# Patient Record
Sex: Female | Born: 1935 | Race: White | Hispanic: No
Health system: Southern US, Community
[De-identification: ages and names within clinical notes are randomized; demographics above are authoritative.]

## PROBLEM LIST (undated history)

## (undated) DIAGNOSIS — I2510 Atherosclerotic heart disease of native coronary artery without angina pectoris: Secondary | ICD-10-CM

---

## 2011-07-29 ENCOUNTER — Inpatient Hospital Stay (INDEPENDENT_AMBULATORY_CARE_PROVIDER_SITE_OTHER)
Admission: RE | Admit: 2011-07-29 | Discharge: 2011-07-29 | Disposition: A | Discharge status: Home / Self Care | Payer: 59 | Source: Ambulatory Visit | Attending: Family Medicine | Admitting: Family Medicine

## 2011-07-29 ENCOUNTER — Encounter: Payer: Self-pay | Admitting: Family Medicine

## 2011-07-29 DIAGNOSIS — S90129A Contusion of unspecified lesser toe(s) without damage to nail, initial encounter: Secondary | ICD-10-CM

## 2011-07-29 DIAGNOSIS — I10 Essential (primary) hypertension: Secondary | ICD-10-CM | POA: Insufficient documentation

## 2011-07-29 DIAGNOSIS — L02619 Cutaneous abscess of unspecified foot: Secondary | ICD-10-CM

## 2011-07-31 ENCOUNTER — Inpatient Hospital Stay (INDEPENDENT_AMBULATORY_CARE_PROVIDER_SITE_OTHER)
Admission: RE | Admit: 2011-07-31 | Discharge: 2011-07-31 | Disposition: A | Discharge status: Home / Self Care | Payer: 59 | Source: Ambulatory Visit | Attending: Family Medicine | Admitting: Family Medicine

## 2011-07-31 ENCOUNTER — Encounter: Payer: Self-pay | Admitting: Family Medicine

## 2011-07-31 DIAGNOSIS — L03039 Cellulitis of unspecified toe: Secondary | ICD-10-CM

## 2011-07-31 DIAGNOSIS — Z4800 Encounter for change or removal of nonsurgical wound dressing: Secondary | ICD-10-CM

## 2011-08-02 ENCOUNTER — Telehealth (INDEPENDENT_AMBULATORY_CARE_PROVIDER_SITE_OTHER): Payer: Self-pay | Admitting: *Deleted

## 2011-08-03 ENCOUNTER — Encounter: Payer: Self-pay | Admitting: Family Medicine

## 2011-08-03 ENCOUNTER — Inpatient Hospital Stay (INDEPENDENT_AMBULATORY_CARE_PROVIDER_SITE_OTHER)
Admission: RE | Admit: 2011-08-03 | Discharge: 2011-08-03 | Disposition: A | Discharge status: Home / Self Care | Payer: 59 | Source: Ambulatory Visit | Attending: Family Medicine | Admitting: Family Medicine

## 2011-08-03 DIAGNOSIS — L02619 Cutaneous abscess of unspecified foot: Secondary | ICD-10-CM

## 2011-08-03 DIAGNOSIS — Z4800 Encounter for change or removal of nonsurgical wound dressing: Secondary | ICD-10-CM

## 2011-08-03 DIAGNOSIS — L03039 Cellulitis of unspecified toe: Secondary | ICD-10-CM

## 2011-10-02 NOTE — Telephone Encounter (Signed)
  Phone Note Outgoing Call Call back at [facility] Phone [phone]   Call placed by: Lajean Saver RN,  August 02, 2011 2:17 PM Call placed to: Patient Action Taken: Phone Call Completed Summary of Call: Callback: Patient denies any pain and has not taken the bandage off. I reminded her to come back tomorrow for a f/u on her toe. She verbalized understanding.

## 2011-10-02 NOTE — Progress Notes (Signed)
Summary: Recheck Toe   Vital Signs:  Patient Profile:   77 Years Old Female CC:      Blister on rt great toe re check Height:     63 inches Weight:      118 pounds O2 Sat:      96 % O2 treatment:    Room Air Temp:     97.7 degrees F oral Pulse rate:   81 / minute Pulse rhythm:   regular Resp:     18 per minute BP sitting:   107 / 68  (left arm) Cuff size:   regular  Vitals Entered By: Emilio Math (August 03, 2011 1:55 PM)                  Current Allergies: No known allergies History of Present Illness Chief Complaint: Blister on rt great toe re check History of Present Illness:  Subjective:  Patient returns for follow-up of her great toe.  She has discontinued her bandage and states that there has been no further pain.  Current Meds CEPHALEXIN 500 MG TABS (CEPHALEXIN) One by mouth three times daily (every 8 hours) * AMLODIPINE daily LISINOPRIL 20 MG TABS (LISINOPRIL) daily HYDROCHLOROTHIAZIDE 25 MG TABS (HYDROCHLOROTHIAZIDE) daily * GABAPENTIN 1-2 daily * ASA 81 MG daily  REVIEW OF SYSTEMS Constitutional Symptoms      Denies fever, chills, night sweats, weight loss, weight gain, and fatigue.  Eyes       Denies change in vision, eye pain, eye discharge, glasses, contact lenses, and eye surgery. Ear/Nose/Throat/Mouth       Denies hearing loss/aids, change in hearing, ear pain, ear discharge, dizziness, frequent runny nose, frequent nose bleeds, sinus problems, sore throat, hoarseness, and tooth pain or bleeding.  Respiratory       Denies dry cough, productive cough, wheezing, shortness of breath, asthma, bronchitis, and emphysema/COPD.  Cardiovascular       Denies murmurs, chest pain, and tires easily with exhertion.    Gastrointestinal       Denies stomach pain, nausea/vomiting, diarrhea, constipation, blood in bowel movements, and indigestion. Genitourniary       Denies painful urination, kidney stones, and loss of urinary control. Neurological  Denies paralysis, seizures, and fainting/blackouts. Musculoskeletal       Denies muscle pain, joint pain, joint stiffness, decreased range of motion, redness, swelling, muscle weakness, and gout.  Skin       Complains of unusual moles/lumps or sores.      Denies bruising and hair/skin or nail changes.  Psych       Denies mood changes, temper/anger issues, anxiety/stress, speech problems, depression, and sleep problems.  Past History:  Past Medical History: Reviewed history from 07/29/2011 and no changes required. Hypertension  Past Surgical History: Reviewed history from 07/29/2011 and no changes required. Appendectomy Tonsillectomy  Family History: Reviewed history from 07/29/2011 and no changes required. Family History of CAD Female 1st degree relative <60 Family History Hypertension  Social History: Reviewed history from 07/29/2011 and no changes required. Never Smoked Alcohol use-no Drug use-no   Objective:  Appears comfortable and alert. Right great toe:  Healing epidermis dorsum of toe.  No evidence infection.  Toe has full range of motion.  Cap refill normal. Assessment  Assessed CELLULITIS, GREAT TOE, RIGHT as improved - Donna Christen MD  Plan New Orders: Est. Patient Level II [phone] Planning Comments:   Applied Bacitracin.  No further bandage necessary.  Recommend that she apply Bacitracin daily until completely healed.  The patient and/or caregiver has been counseled thoroughly with regard to medications prescribed including dosage, schedule, interactions, rationale for use, and possible side effects and they verbalize understanding.  Diagnoses and expected course of recovery discussed and will return if not improved as expected or if the condition worsens. Patient  and/or caregiver verbalized understanding.   Orders Added: 1)  Est. Patient Level II [16109]

## 2011-10-02 NOTE — Progress Notes (Signed)
Summary: SORE TOE   Vital Signs:  Patient Profile:   77 Years Old Female CC:      left lg. toe ulceration Height:     63 inches Weight:      115 pounds O2 Sat:      98 % O2 treatment:    Room Air Temp:     98.1 degrees F oral Pulse rate:   72 / minute Resp:     16 per minute BP sitting:   134 / 80  (left arm) Cuff size:   regular  Pt. in pain?   yes    Location:   left large toe  Vitals Entered By: Lavell Islam RN (July 29, 2011 4:39 PM)                   Updated Prior Medication List: CEPHALEXIN 500 MG TABS (CEPHALEXIN) One by mouth three times daily (every 8 hours) * AMLODIPINE daily LISINOPRIL 20 MG TABS (LISINOPRIL) daily HYDROCHLOROTHIAZIDE 25 MG TABS (HYDROCHLOROTHIAZIDE) daily * GABAPENTIN 1-2 daily * ASA 81 MG daily  Current Allergies: No known allergies History of Present Illness Chief Complaint: left lg. toe ulceration History of Present Illness:  Subjective:  Patient complains of onset of a tender blister on the top of her left great toe about 3 days ago.  She believes that she may have scraped the toe with the tip of her cane but she is not sure.  She opened the blister with a needle, and the toe has become progressively more painful.  No fevers, chills, and sweats   REVIEW OF SYSTEMS       Skin       Complains of unusual moles/lumps or sores.      Comments: left lg ltoe Other Comments: left large toe ulceration x 3 days   Past History:  Past Medical History: Hypertension  Past Surgical History: Appendectomy Tonsillectomy  Family History: Family History of CAD Female 1st degree relative <60 Family History Hypertension  Social History: Never Smoked Alcohol use-no Drug use-no Smoking Status:  never Drug Use:  no   Objective:  Appearance:  Elderly female appears healthy, stated age, and in no acute distress Left foot:  Great toe has large bulla on dorsal surface (from base of nail to MTP joint) that has been partly evacuated.   The toe is mildly erythematous and tender to palpation over dorsal surface.  No joint swelling.  Good range of motion all joints.  Distal neurovascular intact   Assessment New Problems: CONTUSION OF TOE (ICD-924.3) FAMILY HISTORY OF CAD FEMALE 1ST DEGREE RELATIVE <60 (ICD-V16.49) HYPERTENSION (ICD-401.9) CELLULITIS, GREAT TOE, RIGHT (ICD-681.10)   Plan New Medications/Changes: CEPHALEXIN 500 MG TABS (CEPHALEXIN) One by mouth three times daily (every 8 hours)  #30 x 0, 07/29/2011, Donna Christen MD  New Orders: T-Culture, Wound [[id number]] Duoderm Dressing [A6250] New Patient Level IV [16109] Services provided After hours-Weekends-Holidays [99051] Planning Comments:   PROCEDURE:  With aseptic technique, partly opened bulla with iris scissors but did not debride.  Expressed remaining clear fluid and wound culture sent.  Applied Silvadene cream, followed by layer of Mepetel wound dressing.  Finally, wrapped with 1in wide sterile stretchable gauze. Advised to leave dressing in place for 48 hours then return for dressing change.  Keep bandage clean and dry.  Elevate leg.  Apply heating pad several times daily.  Return earlier for increasing pain, swelling, drainage, etc. Wound culture sent.  Begin Keflex.  May take Tylenol for pain.  The patient and/or caregiver has been counseled thoroughly with regard to medications prescribed including dosage, schedule, interactions, rationale for use, and possible side effects and they verbalize understanding.  Diagnoses and expected course of recovery discussed and will return if not improved as expected or if the condition worsens. Patient and/or caregiver verbalized understanding.  Prescriptions: CEPHALEXIN 500 MG TABS (CEPHALEXIN) One by mouth three times daily (every 8 hours)  #30 x 0   Entered and Authorized by:   Donna Christen MD   Signed by:   Donna Christen MD on 07/29/2011   Method used:   Print then Give to Patient   RxID:    [id number]   Orders Added: 1)  T-Culture, Wound [[id number]] 2)  Duoderm Dressing [A6250] 3)  New Patient Level IV [14782] 4)  Services provided After hours-Weekends-Holidays [99051]

## 2011-10-02 NOTE — Progress Notes (Signed)
Summary: rechk toe procedure rm   Vital Signs:  Patient Profile:   77 Years Old Female CC:      f/u wound ck, toe  Height:     63 inches Weight:      118.25 pounds O2 Sat:      98 % O2 treatment:    Room Air Temp:     98.1 degrees F oral Pulse rate:   100 / minute Resp:     18 per minute BP sitting:   120 / 73  (left arm) Cuff size:   regular  Vitals Entered By: Clemens Catholic LPN (July 31, 2011 1:56 PM)                  Updated Prior Medication List: CEPHALEXIN 500 MG TABS (CEPHALEXIN) One by mouth three times daily (every 8 hours) * AMLODIPINE daily LISINOPRIL 20 MG TABS (LISINOPRIL) daily HYDROCHLOROTHIAZIDE 25 MG TABS (HYDROCHLOROTHIAZIDE) daily * GABAPENTIN 1-2 daily * ASA 81 MG daily  Current Allergies (reviewed today): No known allergies History of Present Illness Chief Complaint: f/u wound ck, toe  History of Present Illness:  Subjective:  Patient reports minimal pain.  REVIEW OF SYSTEMS Constitutional Symptoms      Denies fever, chills, night sweats, weight loss, weight gain, and fatigue.  Eyes       Denies change in vision, eye pain, eye discharge, glasses, contact lenses, and eye surgery. Ear/Nose/Throat/Mouth       Denies hearing loss/aids, change in hearing, ear pain, ear discharge, dizziness, frequent runny nose, frequent nose bleeds, sinus problems, sore throat, hoarseness, and tooth pain or bleeding.  Respiratory       Denies dry cough, productive cough, wheezing, shortness of breath, asthma, bronchitis, and emphysema/COPD.  Cardiovascular       Denies murmurs, chest pain, and tires easily with exhertion.    Gastrointestinal       Denies stomach pain, nausea/vomiting, diarrhea, constipation, blood in bowel movements, and indigestion. Genitourniary       Denies painful urination, kidney stones, and loss of urinary control. Neurological       Denies paralysis, seizures, and fainting/blackouts. Musculoskeletal       Denies muscle pain,  joint pain, joint stiffness, decreased range of motion, redness, swelling, muscle weakness, and gout.  Skin       Denies bruising, unusual mles/lumps or sores, and hair/skin or nail changes.  Psych       Denies mood changes, temper/anger issues, anxiety/stress, speech problems, depression, and sleep problems. Other Comments: the pt is here today for a 2 day f/u wound ck, toe.    Past History:  Past Medical History: Reviewed history from 07/29/2011 and no changes required. Hypertension  Past Surgical History: Reviewed history from 07/29/2011 and no changes required. Appendectomy Tonsillectomy  Family History: Reviewed history from 07/29/2011 and no changes required. Family History of CAD Female 1st degree relative <60 Family History Hypertension  Social History: Reviewed history from 07/29/2011 and no changes required. Never Smoked Alcohol use-no Drug use-no   Objective:  Left great toe:  Bandage removed.  No evidence infection.  Dorsal surface of toe remains friable.  No tenderness.  Assessment  Assessed CELLULITIS, GREAT TOE, RIGHT as improved - Donna Christen MD New Problems: ENCOUNTER CHG/REMOVAL NONSURGICAL WOUND DRESSING (ICD-V58.30)  NO EVIDENCE INFECTION.  WOUND CULTURE NEGATIVE GROWTH  Plan New Orders: Est. Patient Level III [96045] Duoderm Dressing [A6250] Planning Comments:   Applied Silvadene cream.  Applied Mepelex Lite dressing.  Advised  to leave in place for 3 days then return for follow-up.  Keep wound clean and dry.  May decrease Keflex to two times a day.   The patient and/or caregiver has been counseled thoroughly with regard to medications prescribed including dosage, schedule, interactions, rationale for use, and possible side effects and they verbalize understanding.  Diagnoses and expected course of recovery discussed and will return if not improved as expected or if the condition worsens. Patient and/or caregiver verbalized understanding.    Orders Added: 1)  Est. Patient Level III [08657] 2)  Duoderm Dressing [A6250]

## 2014-03-05 ENCOUNTER — Encounter (HOSPITAL_BASED_OUTPATIENT_CLINIC_OR_DEPARTMENT_OTHER): Payer: Self-pay | Admitting: Emergency Medicine

## 2014-03-05 ENCOUNTER — Emergency Department (HOSPITAL_BASED_OUTPATIENT_CLINIC_OR_DEPARTMENT_OTHER): Payer: Medicare Other

## 2014-03-05 ENCOUNTER — Emergency Department (HOSPITAL_BASED_OUTPATIENT_CLINIC_OR_DEPARTMENT_OTHER)
Admission: EM | Admit: 2014-03-05 | Discharge: 2014-03-05 | Disposition: A | Discharge status: Other Acute Inpatient Hospital | Payer: Medicare Other | Attending: Emergency Medicine | Admitting: Emergency Medicine

## 2014-03-05 DIAGNOSIS — S0990XA Unspecified injury of head, initial encounter: Secondary | ICD-10-CM | POA: Insufficient documentation

## 2014-03-05 DIAGNOSIS — Z7982 Long term (current) use of aspirin: Secondary | ICD-10-CM | POA: Insufficient documentation

## 2014-03-05 DIAGNOSIS — Z79899 Other long term (current) drug therapy: Secondary | ICD-10-CM | POA: Insufficient documentation

## 2014-03-05 DIAGNOSIS — Z23 Encounter for immunization: Secondary | ICD-10-CM | POA: Insufficient documentation

## 2014-03-05 DIAGNOSIS — T07XXXA Unspecified multiple injuries, initial encounter: Secondary | ICD-10-CM

## 2014-03-05 DIAGNOSIS — W050XXA Fall from non-moving wheelchair, initial encounter: Secondary | ICD-10-CM | POA: Insufficient documentation

## 2014-03-05 DIAGNOSIS — IMO0002 Reserved for concepts with insufficient information to code with codable children: Secondary | ICD-10-CM | POA: Insufficient documentation

## 2014-03-05 DIAGNOSIS — Y929 Unspecified place or not applicable: Secondary | ICD-10-CM | POA: Insufficient documentation

## 2014-03-05 DIAGNOSIS — I2510 Atherosclerotic heart disease of native coronary artery without angina pectoris: Secondary | ICD-10-CM | POA: Insufficient documentation

## 2014-03-05 DIAGNOSIS — Y9389 Activity, other specified: Secondary | ICD-10-CM | POA: Insufficient documentation

## 2014-03-05 HISTORY — DX: Atherosclerotic heart disease of native coronary artery without angina pectoris: I25.10

## 2014-03-05 MED ORDER — TETANUS-DIPHTH-ACELL PERTUSSIS 5-2.5-18.5 LF-MCG/0.5 IM SUSP
0.5000 mL | Freq: Once | INTRAMUSCULAR | Status: AC
  Administered 2014-03-05: 0.5 mL via INTRAMUSCULAR
  Filled 2014-03-05: qty 0.5

## 2014-03-05 NOTE — ED Notes (Signed)
[facility] called to transport patient back to [facility]

## 2014-03-05 NOTE — ED Provider Notes (Signed)
CSN: [id number]     Arrival date & time 03/05/14  1016 History   First MD Initiated Contact with Patient 03/05/14 1025     Chief Complaint  Patient presents with  . Fall  . Head Injury    Patient is a 80 y.o. female presenting with fall and head injury. The history is provided by the patient (nurse called nursing facility).  Fall This is a new problem. The current episode started less than 1 hour ago. The problem occurs constantly. The problem has not changed since onset.Associated symptoms include headaches. Pertinent negatives include no chest pain and no abdominal pain. Nothing aggravates the symptoms. Nothing relieves the symptoms.  Head Injury Associated symptoms: headache   pt presents from nursing facility s/p fall.  She was being transferred from wheelchair when she fell She hit her head and also sustained abrasions to left UE and right LE No new neck or back pain No LOC reported She takes ASA but no anticoagulants reported  Past Medical History  Diagnosis Date  . Coronary artery disease    No past surgical history on file. No family history on file. History  Substance Use Topics  . Smoking status: Unknown If Ever Smoked  . Smokeless tobacco: Not on file  . Alcohol Use: No   OB History   Grav Para Term Preterm Abortions TAB SAB Ect Mult Living                 Review of Systems  Cardiovascular: Negative for chest pain.  Gastrointestinal: Negative for abdominal pain.  Skin: Positive for wound.  Neurological: Positive for headaches.      Allergies  Review of patient's allergies indicates no known allergies.  Home Medications   Prior to Admission medications   Medication Sig Start Date End Date Taking? Authorizing Provider  aspirin 325 MG EC tablet Take 325 mg by mouth daily.   Yes Historical Provider, MD  calcium-vitamin D (OSCAL WITH D) 250-125 MG-UNIT per tablet Take 1 tablet by mouth daily.   Yes Historical Provider, MD  diltiazem (CARDIZEM CD) 180 MG 24  hr capsule Take 180 mg by mouth daily.   Yes Historical Provider, MD  docusate sodium (COLACE) 100 MG capsule Take 100 mg by mouth 2 (two) times daily.   Yes Historical Provider, MD  Loperamide HCl (IMODIUM PO) Take by mouth.   Yes Historical Provider, MD  Magnesium Carbonate 250 MG/GM POWD Take by mouth.   Yes Historical Provider, MD  metoprolol succinate (TOPROL-XL) 25 MG 24 hr tablet Take 25 mg by mouth daily.   Yes Historical Provider, MD  mirtazapine (REMERON) 15 MG tablet Take 15 mg by mouth at bedtime.   Yes Historical Provider, MD  ondansetron (ZOFRAN) 4 MG tablet Take 4 mg by mouth every 8 (eight) hours as needed for nausea or vomiting.   Yes Historical Provider, MD  senna (SENOKOT) 8.6 MG tablet Take 1 tablet by mouth daily.   Yes Historical Provider, MD   BP 160/64  Pulse 65  Temp(Src) 98.9 F (37.2 C) (Oral)  Resp 18  Wt 118 lb (53.524 kg)  SpO2 100% Physical Exam CONSTITUTIONAL: elderly, frail, no distress, watching TV when I enter room HEAD: abrasion/hematoma to forehead no other signs of trauma EYES: EOMI ENMT: Mucous membranes moist, No evidence of facial/nasal trauma NECK: supple no meningeal signs SPINE:entire spine nontender, No bruising/crepitance/stepoffs noted to spine CV: S1/S2 noted Chest - no tenderness noted LUNGS: Lungs are clear to auscultation bilaterally, no apparent  distress ABDOMEN: soft, nontender, no rebound or guarding GU:no cva tenderness NEURO: Pt is awake/alert, moves all extremitiesx4 EXTREMITIES: pulses normal, full ROM.  Abrasion noted to right tibial surface.  Abrasion to left proximal forearm.  No lacerations noted.  All other extremities/joints palpated/ranged and nontender No tenderness with ROM of either hip SKIN: warm, color normal PSYCH: no abnormalities of mood noted   ED Course  Procedures  Imaging Review Ct Head Wo Contrast  03/05/2014   CLINICAL DATA:  Status post fall with an abrasion to the left side of the forehead.  EXAM:  CT HEAD WITHOUT CONTRAST  TECHNIQUE: Contiguous axial images were obtained from the base of the skull through the vertex without intravenous contrast.  COMPARISON:  None.  FINDINGS: There is cortical atrophy and chronic microvascular ischemic change. 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 No evidence of acute intracranial abnormality including hemorrhage, infarct, mass lesion, mass-effect, midline shift or abnormal extra-axial fluid collection is identified. Small appearing contusion over the left frontal bone is identified without underlying fracture or foreign body. There is no pneumocephalus or hydrocephalus.  IMPRESSION: Contusion over the left frontal bone without underlying fracture or acute intracranial abnormality.  Atrophy, chronic microvascular ischemic change and remote right PCA territory infarct.   Electronically Signed   By: Drusilla Kannerhomas  Dalessio M.D.   On: 03/05/2014 10:58   Pt stable She is able to bear weight No acute traumatic injury noted Stable for d/c home   MDM   Final diagnoses:  Minor head injury  Abrasions of multiple sites    Nursing notes including past medical history and social history reviewed and considered in documentation     Joya Gaskinsonald W Carson Meche, MD 03/05/14 1138

## 2014-03-05 NOTE — Discharge Instructions (Signed)

## 2014-03-05 NOTE — ED Notes (Signed)
Pt was transferring from bed to w/c and fell onto the floor.  Hematoma to left side of forehead.  Denies LOC per [facility] home staff.

## 2014-10-30 DEATH — deceased

## 2015-11-25 IMAGING — CT CT HEAD W/O CM
2 series · 16 of 30 positions shown, 18 images · non-contrast
Comparison: None.

CLINICAL DATA: Status post fall with an abrasion to the left side
of the forehead.

EXAM:
CT HEAD WITHOUT CONTRAST
TECHNIQUE: Contiguous axial images were obtained from the base of the skull
through the vertex without intravenous contrast.

[Series 2: head 4.8 h37s · axial · 0.44mm/px · z∈[+1358,+1490]mm · 8 of 35 slices shown, 10 images]
[im 4/35  brain]
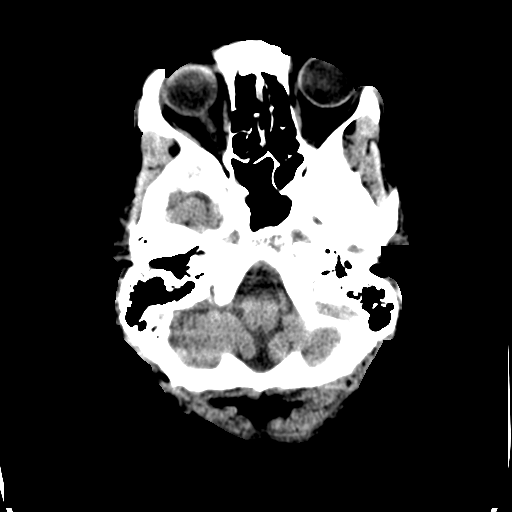
[im 4/35  bone]
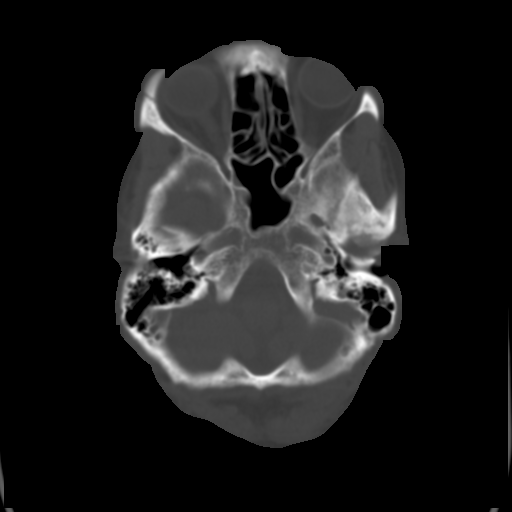
[im 8/35  brain]
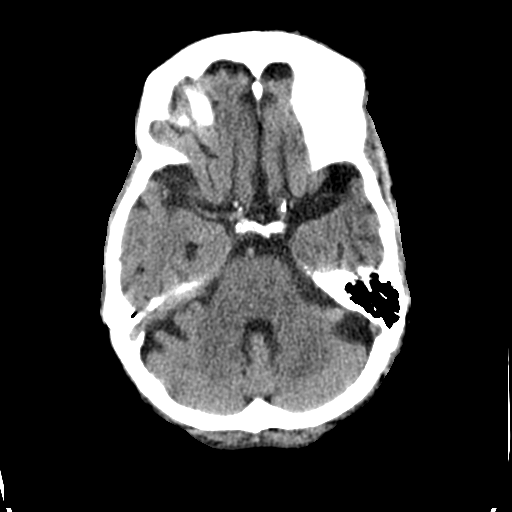
[im 12/35  brain]
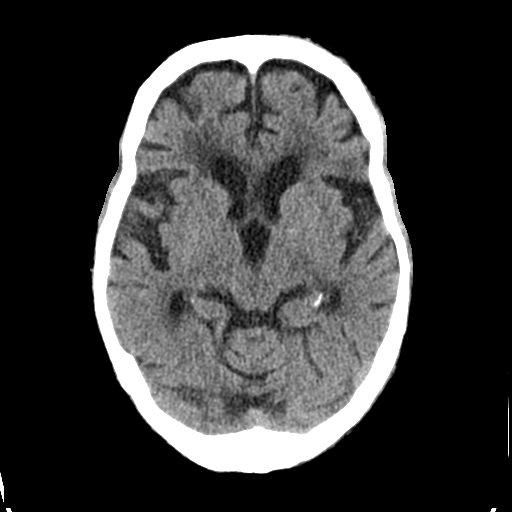
[im 16/35  brain]
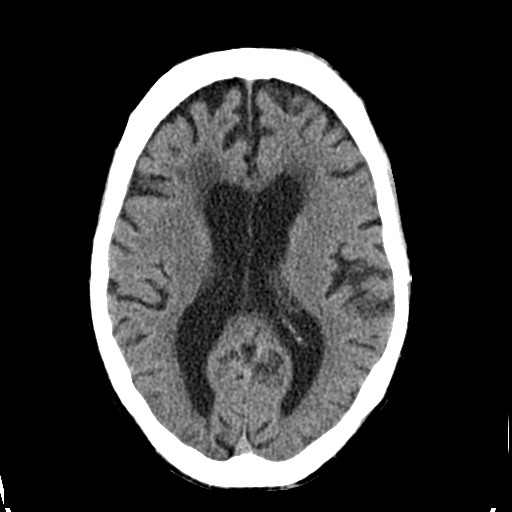
[im 19/35  brain]
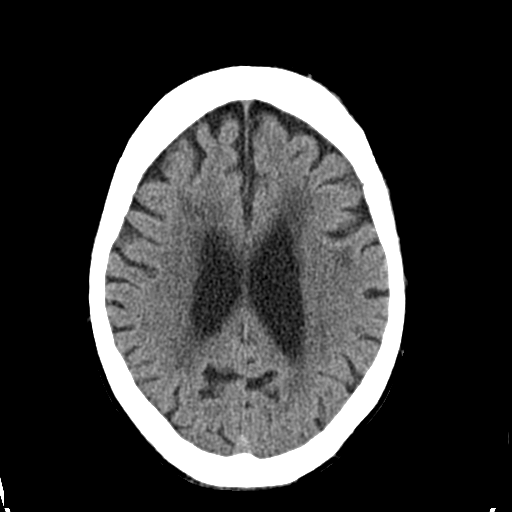
[im 19/35  bone]
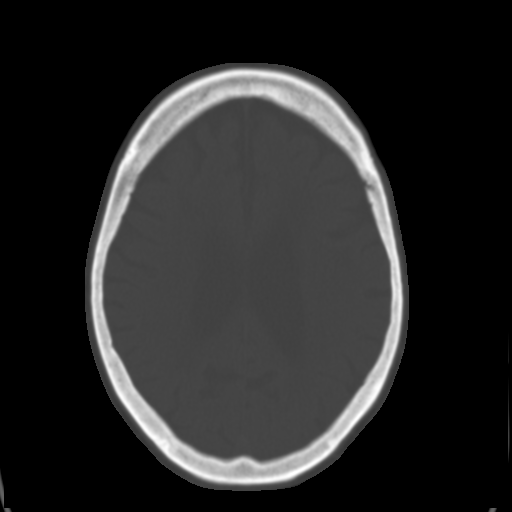
[im 23/35  brain]
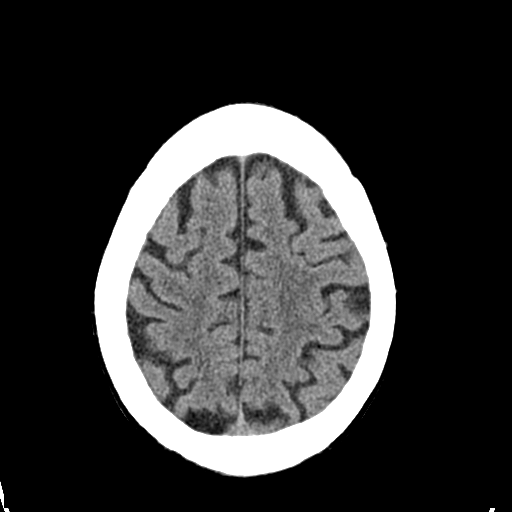
[im 27/35  brain]
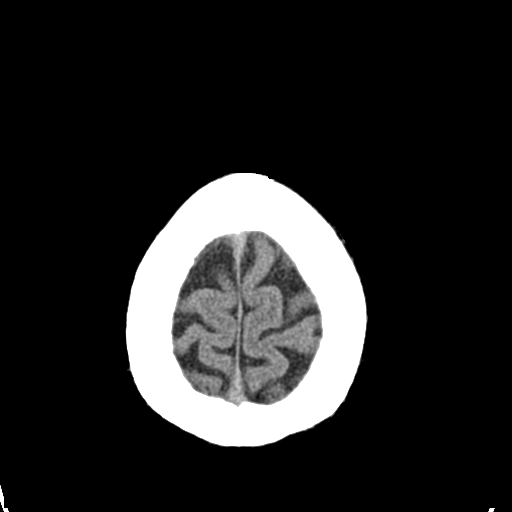
[im 31/35  brain]
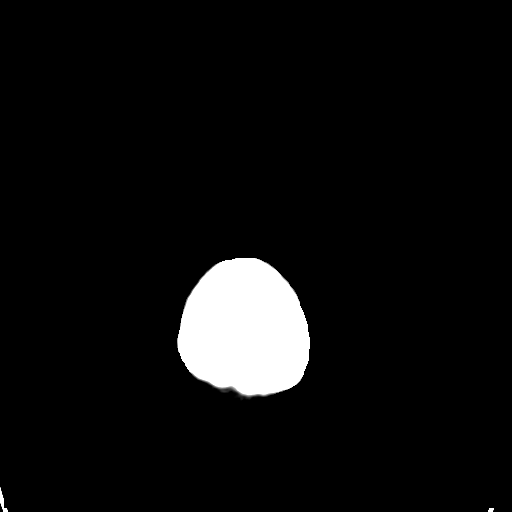

[Series 3: head 2.4 h60s bone · axial · 0.44mm/px · z∈[+1359,+1496]mm · 8 of 72 slices shown]
[im 8/72  bone]
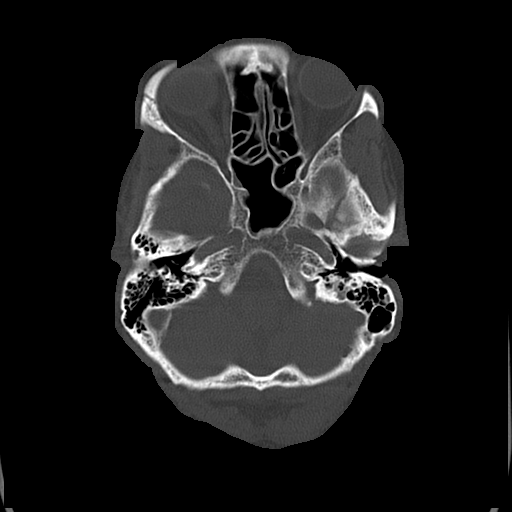
[im 15/72  bone]
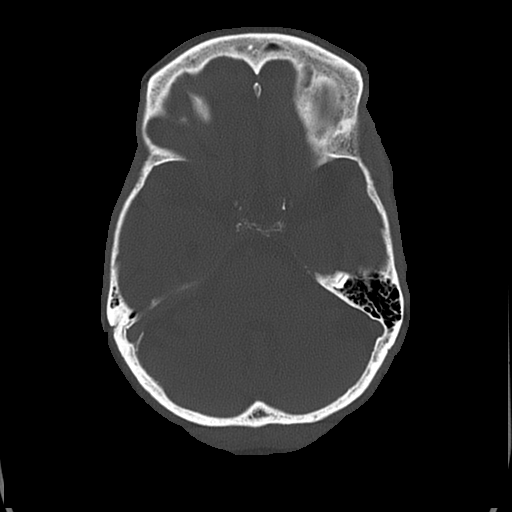
[im 23/72  bone]
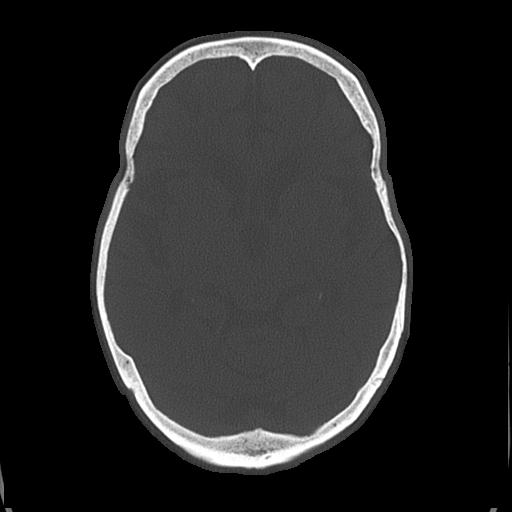
[im 30/72  bone]
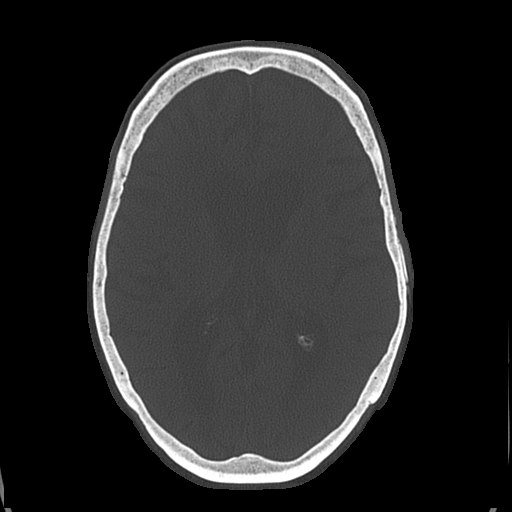
[im 42/72  bone]
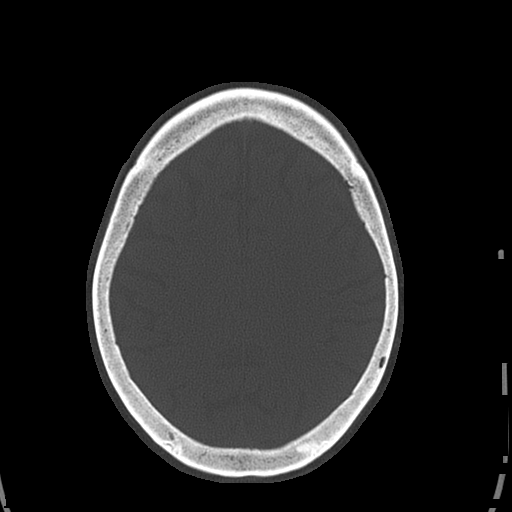
[im 49/72  bone]
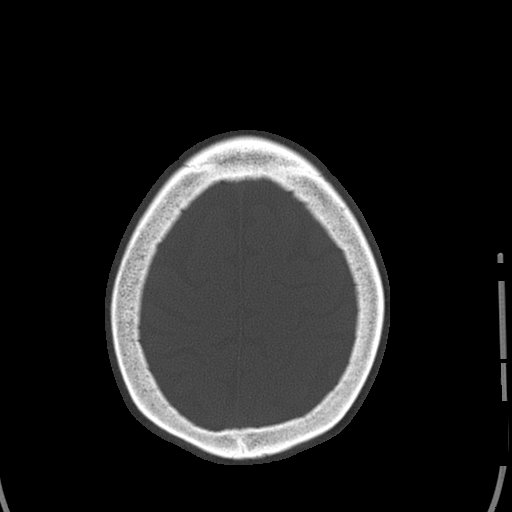
[im 57/72  bone]
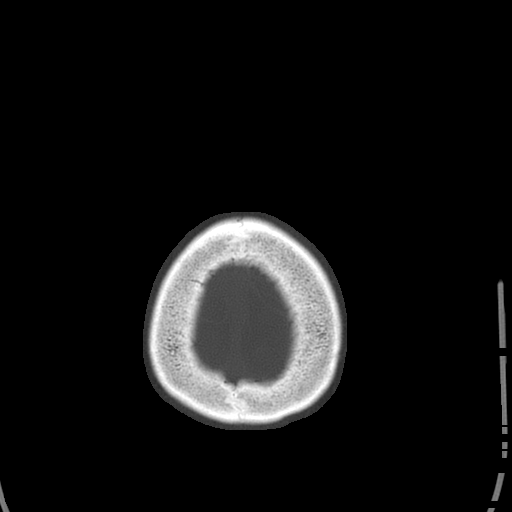
[im 64/72  bone]
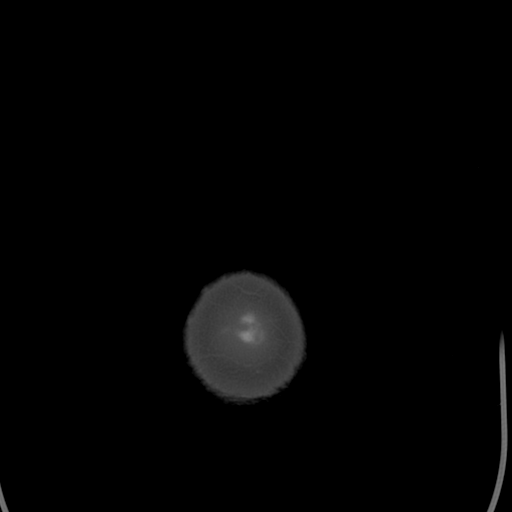

[16 of 30 positions shown; findings below may reference images not displayed]

FINDINGS: There is cortical atrophy and chronic microvascular ischemic change.
Remote right PCA territory infarct is identified. No evidence of
acute intracranial abnormality including hemorrhage, infarct, mass
lesion, mass-effect, midline shift or abnormal extra-axial fluid
collection is identified. Small appearing contusion over the left
frontal bone is identified without underlying fracture or foreign
body. There is no pneumocephalus or hydrocephalus.
IMPRESSION: Contusion over the left frontal bone without underlying fracture or
acute intracranial abnormality.

Atrophy, chronic microvascular ischemic change and remote right PCA
territory infarct.
